# Patient Record
Sex: Female | Born: 1960
Health system: Southern US, Community
[De-identification: ages and names within clinical notes are randomized; demographics above are authoritative.]

## PROBLEM LIST (undated history)

## (undated) DIAGNOSIS — M5430 Sciatica, unspecified side: Secondary | ICD-10-CM

## (undated) HISTORY — PX: CARPAL TUNNEL RELEASE: SHX101

## (undated) HISTORY — PX: BREAST REDUCTION SURGERY: SHX8

## (undated) HISTORY — PX: TUBAL LIGATION: SHX77

---

## 2010-08-14 ENCOUNTER — Emergency Department: Payer: Self-pay | Admitting: Emergency Medicine

## 2014-09-17 ENCOUNTER — Encounter (HOSPITAL_COMMUNITY): Payer: Self-pay | Admitting: *Deleted

## 2014-09-17 ENCOUNTER — Emergency Department (HOSPITAL_COMMUNITY): Payer: BLUE CROSS/BLUE SHIELD

## 2014-09-17 ENCOUNTER — Emergency Department (HOSPITAL_COMMUNITY)
Admission: EM | Admit: 2014-09-17 | Discharge: 2014-09-17 | Disposition: A | Discharge status: Home / Self Care | Payer: BLUE CROSS/BLUE SHIELD | Attending: Emergency Medicine | Admitting: Emergency Medicine

## 2014-09-17 DIAGNOSIS — M654 Radial styloid tenosynovitis [de Quervain]: Secondary | ICD-10-CM

## 2014-09-17 DIAGNOSIS — M6588 Other synovitis and tenosynovitis, other site: Secondary | ICD-10-CM | POA: Insufficient documentation

## 2014-09-17 DIAGNOSIS — M25532 Pain in left wrist: Secondary | ICD-10-CM | POA: Diagnosis present

## 2014-09-17 NOTE — Discharge Instructions (Signed)
Please call your doctor for a followup appointment within 24-48 hours. When you talk to your doctor please let them know that you were seen in the emergency department and have them acquire all of your records so that they can discuss the findings with you and formulate a treatment plan to fully care for your new and ongoing problems. Please call and set-[facility] an [facility]  Please follow up with hand specialist When active and sleeping please wear brace If swelling occurs please apply ice Please apply heat and massage with slow circular motions to the thumb when resting Please continue to monitor symptoms closely and if symptoms are to worsen or change (fever greater than 101, chills, sweating, nausea, vomiting, chest pain, shortness of breathe, difficulty breathing, weakness, numbness, tingling, worsening or changes to pain pattern, swelling, redness, red streaks running down the arm, loss of sensation, white/blue/black discoloration to the thumb, fall, injury, loss of sensation) please report back to the Emergency Department immediately.    De Quervain's Tenosynovitis De Quervain's tenosynovitis involves inflammation of one or two tendon linings (sheaths) or strain of one or two tendons to the thumb: extensor pollicis brevis (EPB), or abductor pollicis longus (APL). This causes pain on the side of the wrist and base of the thumb. Tendon sheaths secrete a fluid that lubricates the tendon, allowing the tendon to move smoothly. When the sheath becomes inflamed, the tendon cannot move freely in the sheath. Both the EPB and APL tendons are important for proper use of the hand. The EPB tendon is important for straightening the thumb. The APL tendon is important for moving the thumb away from the index finger (abducting). The two tendons pass through a small tube (canal) in the wrist, near the base of the thumb. When the tendons become inflamed, pain is usually felt in this  area. SYMPTOMS   Pain, tenderness, swelling, warmth, or redness over the base of the thumb and thumb side of the wrist.  Pain that gets worse when straightening the thumb.  Pain that gets worse when moving the thumb away from the index finger, against resistance.  Pain with pinching or gripping.  Locking or catching of the thumb.  Limited motion of the thumb.  Crackling sound (crepitation) when the tendon or thumb is moved or touched.  Fluid-filled cyst in the area of the base of the thumb. CAUSES   Tenosynovitis is often linked with overuse of the wrist.  Tenosynovitis may be caused by repeated injury to the thumb muscle and tendon units, and with repeated motions of the hand and wrist, due to friction of the tendon within the lining (sheath).  Tenosynovitis may also be due to a sudden increase in activity or change in activity. RISK INCREASES WITH:  Sports that involve repeated hand and wrist motions (golf, bowling, tennis, squash, racquetball).  Heavy labor.  Poor physical wrist strength and flexibility.  Failure to warm up properly before practice or play.  Female gender.  New mothers who hold their baby's head for long periods or lift infants with thumbs in the infant's armpit (axilla). PREVENTION  Warm up and stretch properly before practice or competition.  Allow enough time for rest and recovery between practices and competition.  Maintain appropriate conditioning:  Cardiovascular fitness.  Forearm, wrist, and hand flexibility.  Muscle strength and endurance.  Use proper exercise technique. PROGNOSIS  This condition is usually curable within 6 weeks, if treated properly with non-surgical treatment and resting of the affected  area.  RELATED COMPLICATIONS   Longer healing time if not properly treated or if not given enough time to heal.  Chronic inflammation, causing recurring symptoms of tenosynovitis. Permanent pain or restriction of  movement.  Risks of surgery: infection, bleeding, injury to nerves (numbness of the thumb), continued pain, incomplete release of the tendon sheath, recurring symptoms, cutting of the tendons, tendons sliding out of position, weakness of the thumb, thumb stiffness. TREATMENT  First, treatment involves the use of medicine and ice, to reduce pain and inflammation. Patients are encouraged to stop or modify activities that aggravate the injury. Stretching and strengthening exercises may be advised. Exercises may be completed at home or with a therapist. You may be fitted with a brace or splint, to limit motion and allow the injury to heal. Your caregiver may also choose to give you a corticosteroid injection, to reduce the pain and inflammation. If non-surgical treatment is not successful, surgery may be needed. Most tenosynovitis surgeries are done as outpatient procedures (you go home the same day). Surgery may involve local, regional (whole arm), or general anesthesia.  MEDICATION   If pain medicine is needed, nonsteroidal anti-inflammatory medicines (aspirin and ibuprofen), or other minor pain relievers (acetaminophen), are often advised.  Do not take pain medicine for 7 days before surgery.  Prescription pain relievers are often prescribed only after surgery. Use only as directed and only as much as you need.  Corticosteroid injections may be given if your caregiver thinks they are needed. There is a limited number of times these injections may be given. COLD THERAPY   Cold treatment (icing) should be applied for 10 to 15 minutes every 2 to 3 hours for inflammation and pain, and immediately after activity that aggravates your symptoms. Use ice packs or an ice massage. SEEK MEDICAL CARE IF:   Symptoms get worse or do not improve in 2 to 4 weeks, despite treatment.  You experience pain, numbness, or coldness in the hand.  Blue, gray, or dark color appears in the fingernails.  Any of the  following occur after surgery: increased pain, swelling, redness, drainage of fluids, bleeding in the affected area, or signs of infection.  New, unexplained symptoms develop. (Drugs used in treatment may produce side effects.) Document Released: 06/16/2005 Document Revised: 09/08/2011 Document Reviewed: 09/28/2008 [facility] Patient Information 2015 [place], [place]. This information is not intended to replace advice given to you by your health care provider. Make sure you discuss any questions you have with your health care provider.   Emergency Department Resource Guide 1) Find a Doctor and Pay Out of Pocket Although you won't have to find out who is covered by your insurance plan, it is a good idea to ask around and get recommendations. You will then need to call the office and see if the doctor you have chosen will accept you as a new patient and what types of options they offer for patients who are self-pay. Some doctors offer discounts or will set up payment plans for their patients who do not have insurance, but you will need to ask so you aren't surprised when you get to your appointment.  2) Contact Your Local Health Department Not all health departments have doctors that can see patients for sick visits, but many do, so it is worth a call to see if yours does. If you don't know where your local health department is, you can check in your phone book. The CDC also has a tool to help you locate  your state's health department, and many state websites also have listings of all of their local health departments.  3) Find a [facility] If your illness is not likely to be very severe or complicated, you may want to try a walk in clinic. These are popping up all over the country in pharmacies, drugstores, and shopping centers. They're usually staffed by nurse practitioners or physician assistants that have been trained to treat common illnesses and complaints. They're usually fairly quick and  inexpensive. However, if you have serious medical issues or chronic medical problems, these are probably not your best option.  No Primary Care Doctor: - Call Health Connect at  [phone] - they can help you locate a primary care doctor that  accepts your insurance, provides certain services, etc. - Physician Referral Service- [phone]  Chronic Pain Problems: Organization         Address  Phone   Notes  [facility]  [phone] Patients need to be referred by their primary care doctor.   Medication Assistance: Organization         Address  Phone   Notes  [facility] Medication [facility] [address] [place], [place] [address] [phone] --Must be a resident of [facility] -- Must have NO insurance coverage whatsoever (no Medicaid/ Medicare, etc.) -- The pt. MUST have a primary care doctor that directs their care regularly and follows them in the community   [facility]  [phone]   [employer]  [phone]    Agencies that provide inexpensive medical care: Organization         Address  Phone   Notes  [place]  [phone]   [facility]    [phone]   [facility] [place], [place] [address] [phone]   [place] [address] [place] [phone]   Planned Parenthood    [phone]   [facility]    [phone]   [place] [address], [place] Phone:  [phone], Fax:  [phone] Hours of Operation:  9 am - 6 pm, M-F.  Also accepts Medicaid/Medicare and self-pay.  [facility] for [place] [address], [address], [place] Phone: [phone], Fax: [phone]. Hours of Operation:  8:30 am - 5:30 pm, M-F.  Also accepts Medicaid and self-pay.  [facility] [place] [address] [place] Phone: [phone]   [place], [place], [place] [phone], Ext. [phone] Mondays & Thursdays: 7-9 AM.  First 15 patients are seen on a first come, first serve basis.    [place] Providers:  Organization         Address  Phone   Notes  [facility] [address], [place] [phone] Also accepts self-pay patients.  [facility] [address], [place]  [phone]   [place], [address], [place] [phone]   [facility] [address], [place] [phone]   Lucianne Lei [address], [place]   [phone] Only accepts [place] Access [place] patients after they have their name applied to their card.   Self-Pay (no insurance) in [facility]:  Organization  Address  Phone   Notes  Sickle Cell Patients, [facility] [place] [phone]   [facility] [place] [phone]   [facility]  [place], [address], [place] [phone]   [facility]/Dr. Osei-Bonsu  [address] [place] or [address], [place] [phone] Phone number for both [place] and [place] locations is the same.  [facility] and [facility] [address] [place] [phone]   [facility] [address], [place] or [address] [phone]   [facility] [address], [place] [phone], phone; [phone], fax Sees patients 1st and 3rd Saturday of every month.  Must not qualify for public or private insurance (i.e. Medicaid, Medicare, [place] Health Choice, Veterans' Benefits)  Household income should be no more than 200% of the poverty level The clinic cannot treat you if you are pregnant or  think you are pregnant  Sexually transmitted diseases are not treated at the clinic.    Dental Care: Organization         Address  Phone  Notes  [facility] Department of [facility] [place] [phone] Accepts children up to age 3 who are enrolled in [place] or [place]; pregnant women with a Medicaid card; and children who have applied for Medicaid or [place] Health Choice, but were declined, whose parents can pay a reduced fee at time of service.  [facility]  [address], [place] [phone] Accepts children up to age 81 who are enrolled in [place] or [place]; pregnant women with a Medicaid card; and children who have applied for Medicaid or [place] Health Choice, but were declined, whose parents can pay a reduced fee at time of service.  [place] [facility]  [place] [phone] Patients are seen by appointment only. Walk-ins are not accepted. [place] will see patients 82 years of age and older. Monday - Tuesday (8am-5pm) Most Wednesdays (8:30-5pm) $30 per visit, cash only  [facility] PRO[facility]  [address], [facility] [phone] Patients are seen by appointment only. Walk-ins are not accepted. [place] will see patients 17 years of age and older. One Wednesday Evening (Monthly: Volunteer Based).  $30 per visit, cash only  [place]  [phone] for adults; Children under age 57, call Graduate Pediatric Dentistry at [phone]. Children aged 44-14, please call [phone] to request a pediatric application.  Dental services are provided in all areas of dental care including fillings, crowns and bridges, complete and partial dentures, implants, gum treatment, root canals, and extractions. Preventive care is also provided. Treatment is provided to both adults  and children. Patients are selected via a lottery and there is often a waiting list.   [facility] [address] [place]  [phone] [email or web address]   [facility] [address] [place], [place] [phone], Ext. [phone] Second and Fourth Thursday of each month, opens at 6:30 AM; Clinic ends at 9 AM.  Patients are seen on a first-come first-served basis, and a limited number are seen during each clinic.   [facility]  [address] [place], [place]  [place], [place] [phone]   Eligibility Requirements °You must have lived in [place], [place], or [place] counties for at least the last three months. °  You cannot be eligible for state or federal sponsored healthcare insurance, including Veterans Administration, Medicaid, or Medicare. °  You generally cannot be eligible for healthcare insurance through your employer.  °  How to apply: °Eligibility screenings are held every Tuesday and Wednesday afternoon from 1:00 pm until 4:00 pm. You do not need an appointment for the interview!  °[facility] [address], [place], [place] [phone]   °[place] [facility]  [phone]   °[facility]  [phone]   °[facility]  336-570-6415   ° °Behavioral Health Resources in the Community: °Intensive Outpatient Programs °Organization         Address  Phone  Notes  °[facility] [address], [place], [place] [phone]   °[facility] [address], [place], [place] [phone]   °[facility] [address], [place], [place] ° 336-882-2125   °[facility] [address],  °[place], [place] 1-800-853-5163 or 336-641-4981   °Substance Abuse Resources °Organization         Address  Phone  Notes  °[facility]  336-882-2125   °[facility]  336-784-9470   °[facility]  336-285-9073    °[facility]  336-845-3988   °Residential & Outpatient Substance Abuse Program  1-800-659-3381   °Psychological Services °Organization         Address  Phone  Notes  °[facility]  336- [phone]   °[facility]  336- 378-7881   °[facility] [address], [place] 1-800-853-5163 or 336-641-4981   ° °Mobile Crisis Teams °Organization         Address  Phone  Notes  °[facility], Mobile Crisis Care Unit  1-877-626-1772   °Assertive °[facility] ° [address] [place], [place] 336-834-9664   °[place]aron DeEsch [address] °[place] 336-554-5454   ° °Self-Help/Support Groups °Organization         Address  Phone             Notes  °[facility] [place] - variety of support groups  336- 373-1402 Call for more information  °Narcotics Anonymous (NA), [facility] [address], °[place]  2 meetings at this location  ° °Residential Treatment Programs °Organization         Address  Phone  Notes  °[facility] [address],    °[place]  1-866-801-8205   °[facility] ° [address], [place], [place] [phone]   °[facility] [address], [place] 336-845-3988 Admissions: 8am-3pm M-F  °[facility] [address]    °[place], [place] 336-841-1104   °[facility] [address], [place], [place] 336-379-7146   °[facility] [address]  °[place], [place] 336-285-9073   °[facility] - Intensive Outpatient [address], [place], [place] 336-852-3033   °[facility] [address]  °[place], [place] 1-877-615-2722 or 336-784-9470   °[facility] ([facility]) [address] [place], [place] 336-227-7417 Accepts Medicaid  °[facility] [address]  °[place] 1-800-659-3381 Substance Abuse/Addiction Treatment  ° °[place]  Behavioral Health Resources °Organization           Address  Phone  Notes  °[facility]  (888) [phone]   °Julie Brannon, PhD [address] [place], [place]   (336) 349-5553 or (336) 951-0000   °[facility]   [address] °[place], [place] (336) 349-4454   °[facility] [address], [place], [place] (336) 342-8316 Insurance/Medicaid/sponsorship through [facility]  °[facility] [address]                                    [place], [place] (336) 342-8316 Therapy/tele-psych/case  °[facility] [address]  ° [place], [place] (336) 349-2233    °Dr. Arfeen  (336) 349-4544   °[facility] [place]  [employer] [place] [facility] 1) [address], [place] °2) [address], [place] °3)  [address], [place] (336) 349-3220 °(336) 342-7768 ° °(336) 342-8140   °[place] Child Abuse Hotline (336) 342-1394 or (336) 342-3537 (After Hours)    ° ° ° °

## 2014-09-17 NOTE — ED Provider Notes (Signed)
CSN: [id number]     Arrival date & time 09/17/14  0919 History   First MD Initiated Contact with Patient 09/17/14 [phone]     Chief Complaint  Patient presents with  . Wrist Pain     (Consider location/radiation/quality/duration/timing/severity/associated sxs/prior Treatment) The history is provided by the patient. No language interpreter was used.  Lisa Wagner is a 54 year old female with past medical history of breast reduction surgery, carpal tunnel release, tubal ligation presenting to the ED with left wrist pain has been ongoing since February 2016. Patient reports that the pain comes and goes. Reported that the pain is mainly localized at the base of the thumb, radial aspect of the left wrist. Reported that when she stretches her thumb she feels a popping sensation and that it radiates down her forearm. Described as an aching pain that is sore to the touch. Reported that [facility] June/July 2015 [facility] where she was given a shot of steroids. Reported that she's been using Aleve with minimal relief. Reported that she is right-hand dominant. Denied fall, injury, changes to skin colored, fevers, loss of sensation, numbness, tingling. PCP none  History reviewed. No pertinent past medical history. Past Surgical History  Procedure Laterality Date  . Tubal ligation    . Carpal tunnel release    . Breast reduction surgery     No family history on file. History  Substance Use Topics  . Smoking status: Never Smoker   . Smokeless tobacco: Not on file  . Alcohol Use: No   OB History    No data available     Review of Systems  Constitutional: Negative for fever and chills.  Musculoskeletal: Positive for arthralgias (Left wrist pain). Negative for neck pain and neck stiffness.  Skin: Negative for color change.  Neurological: Negative for weakness and numbness.      Allergies  Review of patient's allergies indicates not on file.  Home Medications     Prior to Admission medications   Not on File   BP 128/60 mmHg  Pulse 77  Temp(Src) 98.3 F (36.8 C) (Oral)  Resp 17  SpO2 100% Physical Exam  Constitutional: She is oriented to person, place, and time. She appears well-developed and well-nourished. No distress.  HENT:  Head: Normocephalic and atraumatic.  Eyes: Conjunctivae and EOM are normal. Right eye exhibits no discharge. Left eye exhibits no discharge.  Neck: Normal range of motion. Neck supple.  Cardiovascular: Normal rate, regular rhythm and normal heart sounds.   Pulses:      Radial pulses are 2+ on the right side, and 2+ on the left side.  Pulmonary/Chest: Effort normal and breath sounds normal. No respiratory distress. She has no wheezes. She has no rales.  Abdominal: Soft.  Musculoskeletal: Normal range of motion. She exhibits tenderness.       Left wrist: She exhibits tenderness. She exhibits normal range of motion, no bony tenderness, no swelling, no effusion, no crepitus and no deformity.       Arms: Very mild swelling identified to the radial aspect of the left wrist with mild tenderness upon palpation to the tendon following the radial aspect of the left wrist. Negative erythema, warmth upon palpation, lesions, sores, deformities. Negative crepitus upon palpation. Negative snuffbox tenderness. Full range of motion to the left thumb identified with discomfort. Negative Allen's or Tinel's sign. Positive Finkelstein test.  Neurological: She is alert and oriented to person, place, and time. No cranial nerve deficit. She  exhibits normal muscle tone. Coordination normal.  Strength 5+/5+ to upper extremities bilaterally with resistance applied, equal distribution noted Strength intact to MCP, PIP, DIP joints of left hand Sensation intact with differentiation to sharp and dull touch  Skin: Skin is warm and dry. No rash noted. She is not diaphoretic. No erythema.  Psychiatric: She has a normal mood and affect. Her behavior is  normal. Thought content normal.  Nursing note and vitals reviewed.   ED Course  Procedures (including critical care time) Labs Review Labs Reviewed - No data to display  Imaging Review Dg Wrist Complete Left  09/17/2014   CLINICAL DATA:  Left wrist pain for 2 months  EXAM: LEFT WRIST - COMPLETE 3+ VIEW  COMPARISON:  None.  FINDINGS: No acute fracture or dislocation is noted. Some lucency is noted in the distal radius which appears postsurgical in nature. Correlation with patient's history is recommended.  IMPRESSION: No acute abnormality noted.   Electronically Signed   By: Alcide Clever M.D.   On: 09/17/2014 10:04     EKG Interpretation None      MDM   Final diagnoses:  Tendinitis, de Quervain's    Medications - No data to display  Filed Vitals:   09/17/14 0929  BP: 128/60  Pulse: 77  Temp: 98.3 F (36.8 C)  TempSrc: Oral  Resp: 17  SpO2: 100%   Plain film of left wrist no acute abnormalities identified. Doubt septic joint. Doubt tenosynovitis. Suspicion to be de Quervain's tendinitis secondary to positive Finkelstein test. Cannot rule out possible beginnings of carpal tunnel. Radial pulses palpable and strong. Strength intact. Equal grip strength. Full range of motion identified. Sensation intact. Negative focal neurological deficits. Patient placed in thumb spica for comfort purposes. Patient stable, afebrile. Patient not septic appearing. Discharged patient. [facility] and [facility] specialist. Discussed with patient to rest, ice, apply heat and massage. Discussed with patient to closely monitor symptoms and if symptoms are to worsen or change to report back to the ED - strict return instructions given.  Patient agreed to plan of care, understood, all questions answered.    Raymon Mutton, PA-C 09/17/14 1040  Azalia Bilis, MD 09/17/14 [phone]

## 2014-09-17 NOTE — ED Notes (Signed)
Pt reports pain at base of left thumb into wrist, "sore to the touch" symptoms since Feb with worsening symptoms mid Feb. Also report rt middle finger "locking up" for 5-6 months on and of

## 2015-10-20 IMAGING — CR DG WRIST COMPLETE 3+V*L*
4 series · 4 of 4 positions shown · non-contrast
Comparison: None.

CLINICAL DATA: Left wrist pain for 2 months

EXAM:
LEFT WRIST - COMPLETE 3+ VIEW

[x wrist pa left]
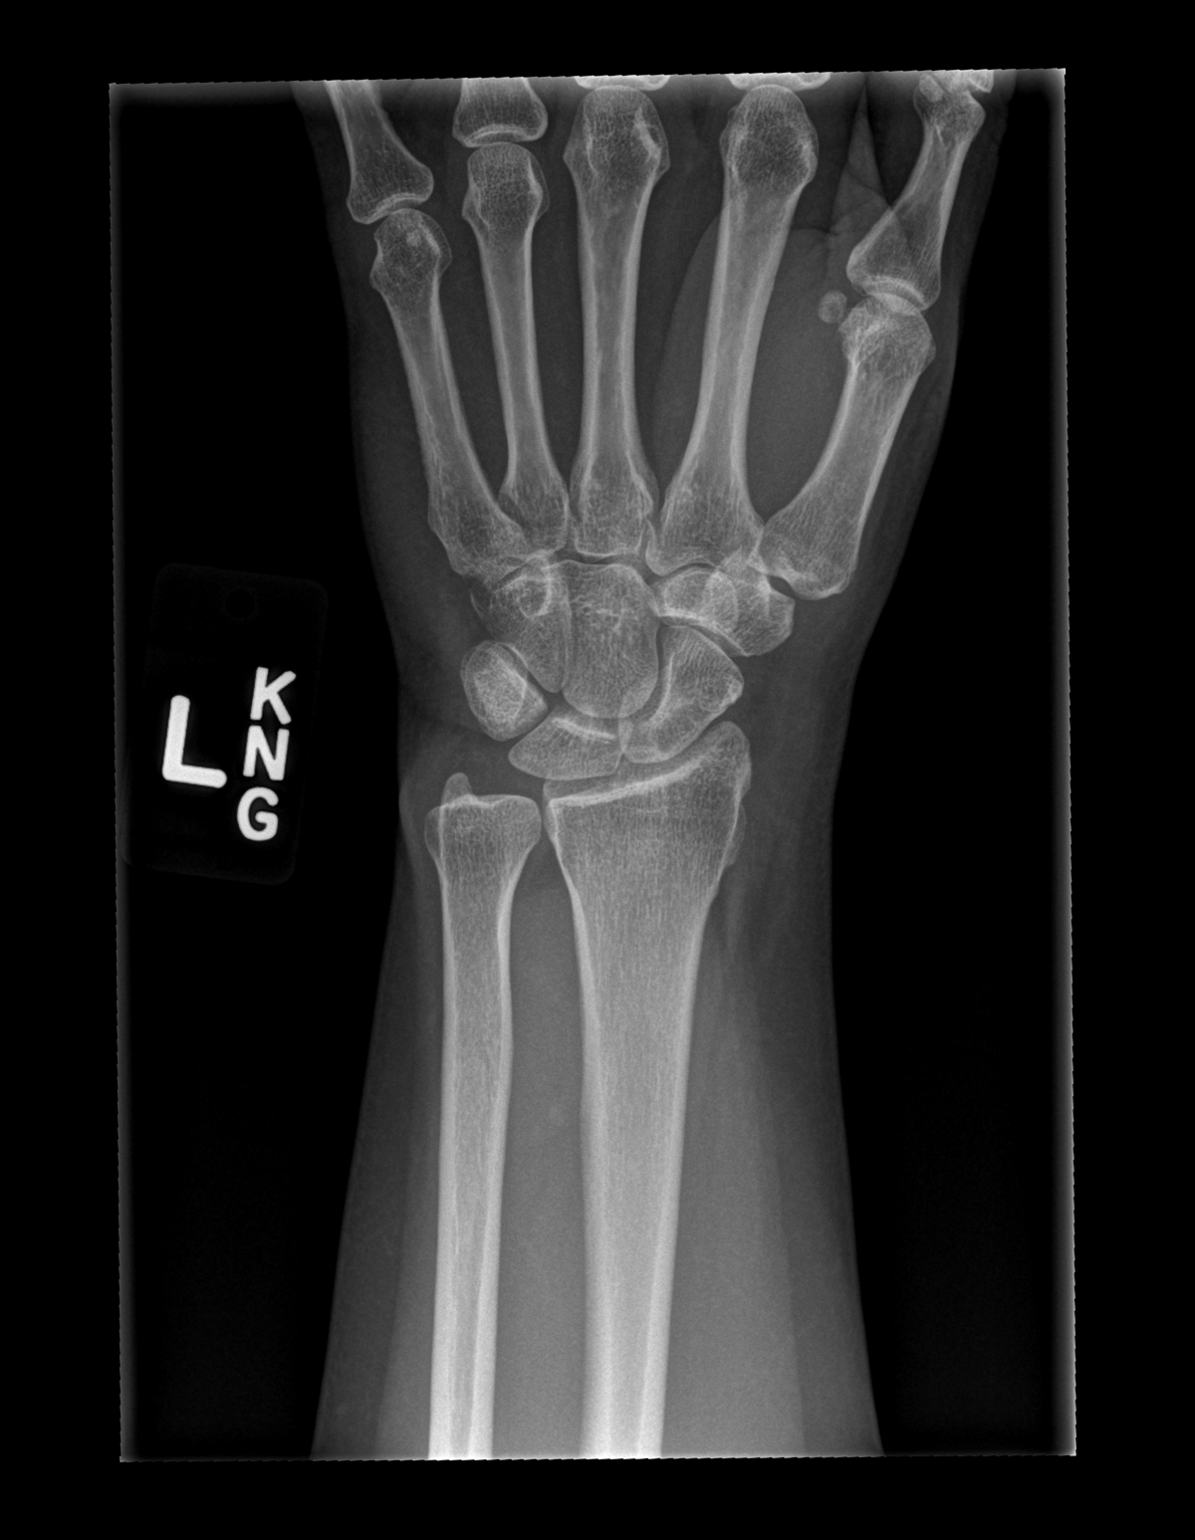

[x wrist obl left]
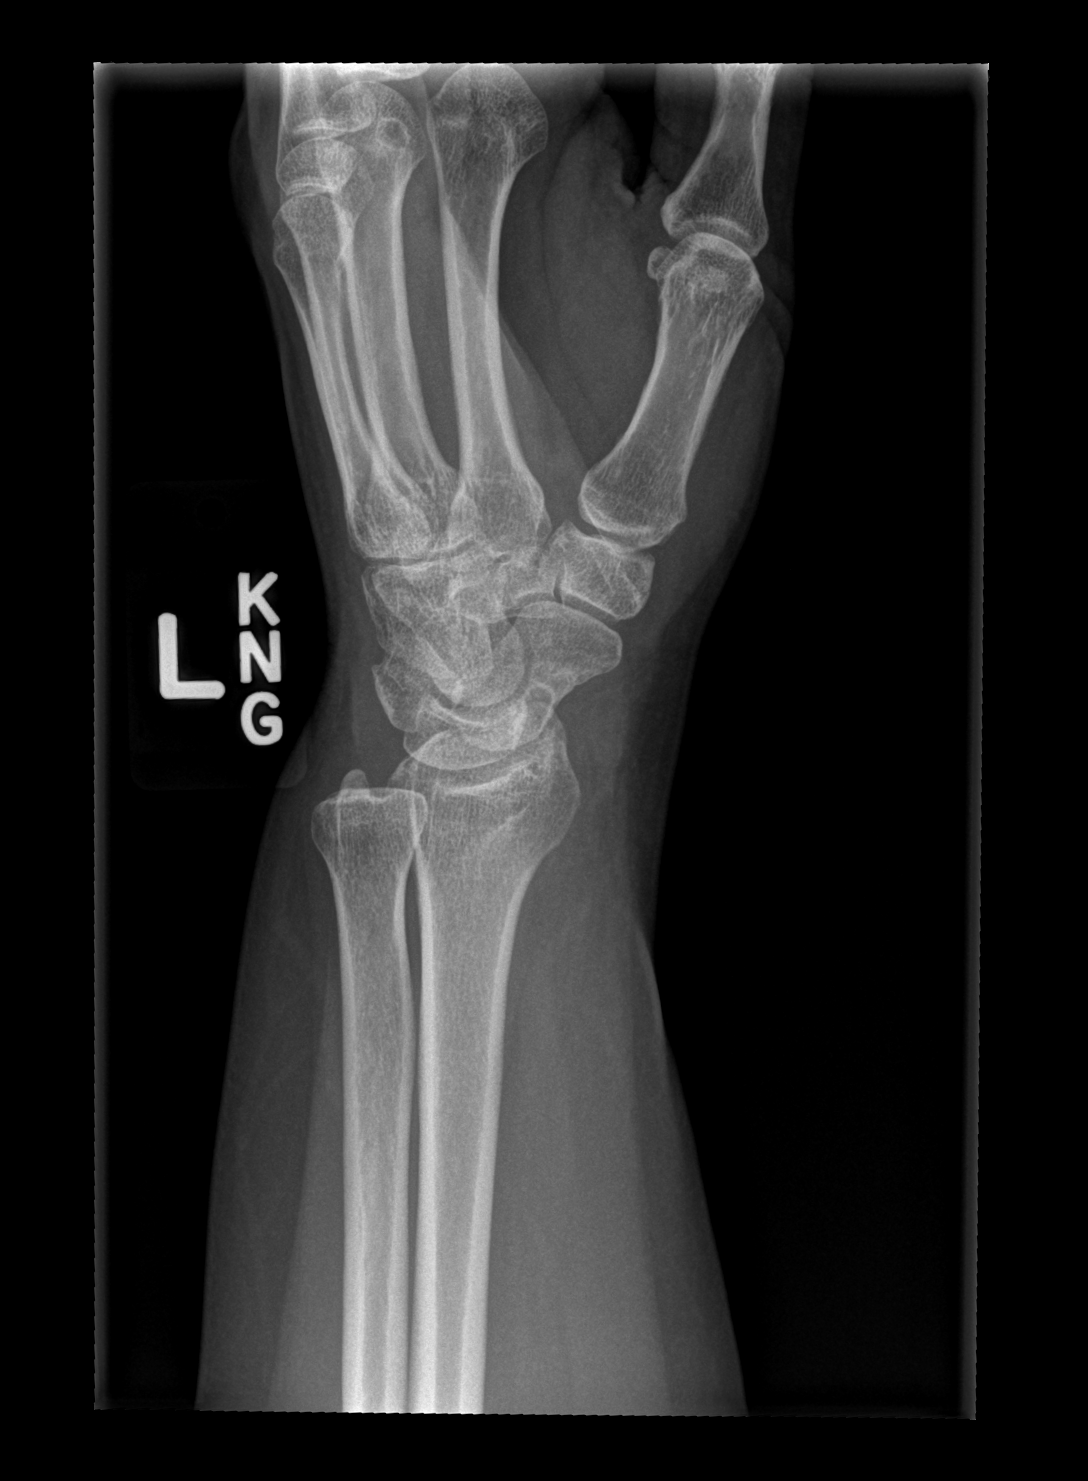

[x wrist lat left]
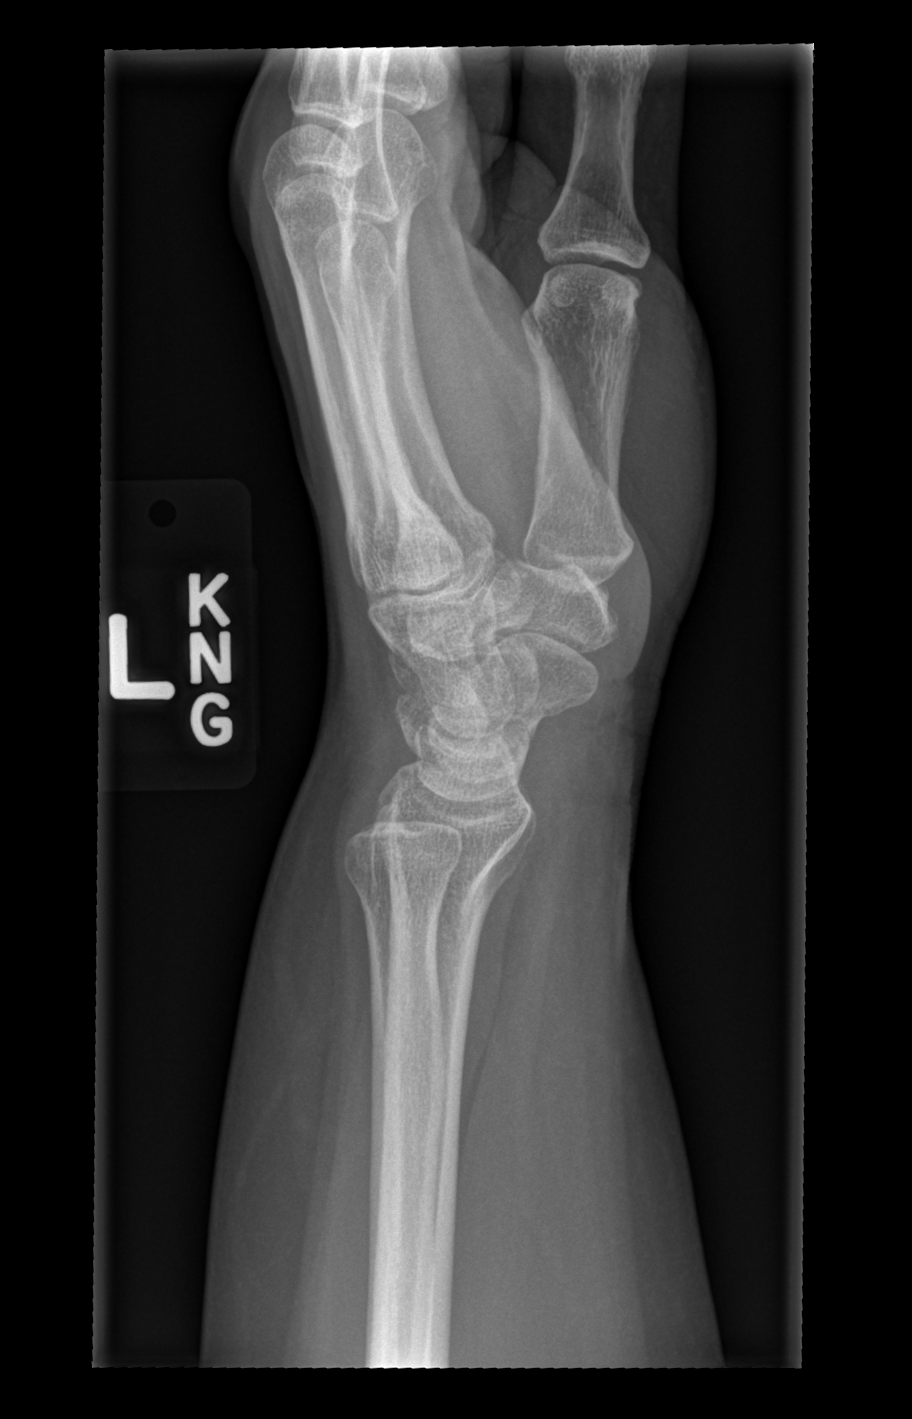

[x wrist navicular view left]
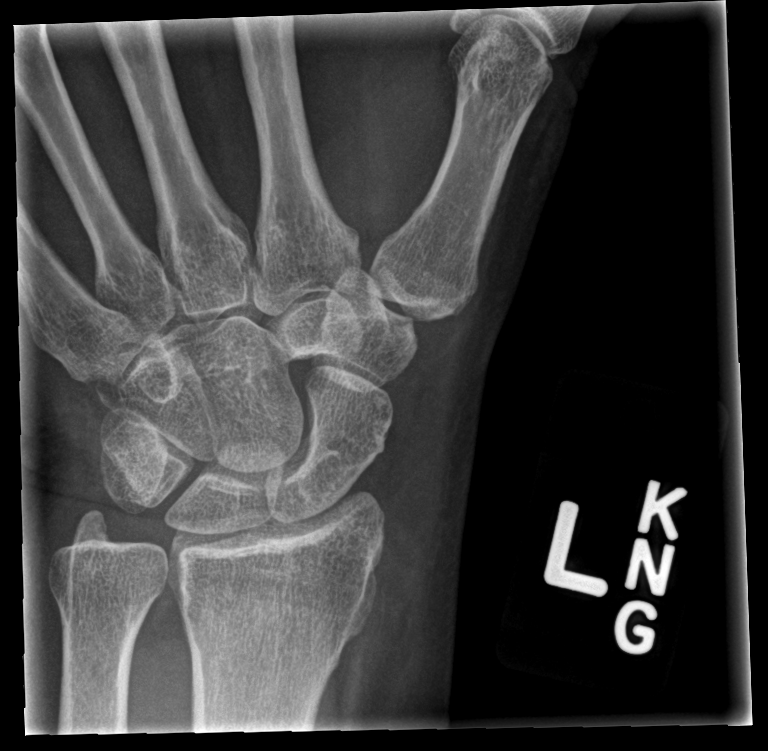

[4 of 4 positions shown; findings below may reference images not displayed]

FINDINGS: No acute fracture or dislocation is noted. Some lucency is noted in
the distal radius which appears postsurgical in nature. Correlation
with patient's history is recommended.
IMPRESSION: No acute abnormality noted.

## 2016-06-01 ENCOUNTER — Encounter (HOSPITAL_BASED_OUTPATIENT_CLINIC_OR_DEPARTMENT_OTHER): Payer: Self-pay | Admitting: *Deleted

## 2016-06-01 ENCOUNTER — Emergency Department (HOSPITAL_BASED_OUTPATIENT_CLINIC_OR_DEPARTMENT_OTHER)
Admission: EM | Admit: 2016-06-01 | Discharge: 2016-06-01 | Disposition: A | Discharge status: Home / Self Care | Payer: BLUE CROSS/BLUE SHIELD | Attending: Emergency Medicine | Admitting: Emergency Medicine

## 2016-06-01 DIAGNOSIS — H6501 Acute serous otitis media, right ear: Secondary | ICD-10-CM

## 2016-06-01 DIAGNOSIS — J069 Acute upper respiratory infection, unspecified: Secondary | ICD-10-CM | POA: Insufficient documentation

## 2016-06-01 MED ORDER — DEXAMETHASONE 6 MG PO TABS
12.0000 mg | ORAL_TABLET | Freq: Once | ORAL | Status: AC
Start: 2016-06-01 — End: 2016-06-01
  Administered 2016-06-01: 12 mg via ORAL
  Filled 2016-06-01: qty 2

## 2016-06-01 NOTE — Discharge Instructions (Signed)
Use Afrin Nasal Spray for the next three days - DO NOT USE IT FOR MORE THAN THREE DAYS!  If the ingredients list on your Sudafed box shows only one medication (pseudoephedrine) then take Claritin or Zyrtec along with it.  If pain gets worse or if you start running a fever, then you will need to be checked to see if you need to start on antibiotics at that point.

## 2016-06-01 NOTE — ED Provider Notes (Signed)
[facility]   CSN: [id number] Arrival date & time: 06/01/16  0208     History   Chief Complaint Chief Complaint  Patient presents with  . Otalgia    HPI Lisa Wagner is a 55 y.o. female.  She has had a cold with nasal congestion and rhinorrhea for about the last 5 days. For the last 3 days, she has noted that there is fullness in her right ear and sounds are muffled. There is not any actual pain from this. She continues to have nasal mucus production which is mostly clear. She denies fever, chills, sweats. There is mild throat irritation but no true sore throat. There is minimal cough. There's been no vomiting or diarrhea. She denies arthralgias or myalgias. She brought some Sudafed which she has been taking without relief. She is not sure there is an antihistamine that is combined with her Sudafed. She does admit to some sick contacts at work.   The history is provided by the patient.    History reviewed. No pertinent past medical history.  There are no active problems to display for this patient.   Past Surgical History:  Procedure Laterality Date  . BREAST REDUCTION SURGERY    . CARPAL TUNNEL RELEASE    . TUBAL LIGATION      OB History    No data available       Home Medications    Prior to Admission medications   Not on File    Family History No family history on file.  Social History Social History  Substance Use Topics  . Smoking status: Never Smoker  . Smokeless tobacco: Never Used  . Alcohol use No     Allergies   Latex   Review of Systems Review of Systems  All other systems reviewed and are negative.    Physical Exam Updated Vital Signs BP 119/66 (BP Location: Right Arm)   Pulse 78   Temp 98.6 F (37 C) (Oral)   Resp 20   Ht 5\' 6"  (1.676 m)   Wt 206 lb (93.4 kg)   SpO2 96%   BMI 33.25 kg/m   Physical Exam  Nursing note and vitals reviewed.  55 year old female, resting comfortably and in no  acute distress. Vital signs are normal. Oxygen saturation is 96%, which is normal. Head is normocephalic and atraumatic. PERRLA, EOMI. Oropharynx is clear. Right ear shows a serous otitis media without erythema or bulging of the tympanic membrane. Left tympanic membrane is normal. Examination the nasal cavity shows no drainage or edema of turbinates. Neck is nontender and supple without adenopathy or JVD. Back is nontender and there is no CVA tenderness. Lungs are clear without rales, wheezes, or rhonchi. Chest is nontender. Heart has regular rate and rhythm without murmur. Abdomen is soft, flat, nontender without masses or hepatosplenomegaly and peristalsis is normoactive. Extremities have no cyanosis or edema, full range of motion is present. Skin is warm and dry without rash. Neurologic: Mental status is normal, cranial nerves are intact, there are no motor or sensory deficits.  ED Treatments / Results   Procedures Procedures (including critical care time)  Medications Ordered in ED Medications  dexamethasone (DECADRON) tablet 12 mg (not administered)     Initial Impression / Assessment and Plan / ED Course  I have reviewed the triage vital signs and the nursing notes.  Pertinent labs & imaging results that were available during my care of the patient were reviewed by me and  considered in my medical decision making (see chart for details).  Clinical Course    Upper respiratory infection with serous otitis media on the right. I have explained to patient this is not an active infection at this point and does not require antibiotics. However, it was explained that her cam be a secondary infection. She's given a dose of dexamethasone in the ED and is advised to add an antihistamine to her regimen if her pseudoephedrine does not already contain an antihistamine. She is also advised to use Afrin nasal spray for the next 3 days. Return should she develop fever or increased pain.  Final  Clinical Impressions(s) / ED Diagnoses   Final diagnoses:  Right acute serous otitis media, recurrence not specified    New Prescriptions New Prescriptions   No medications on file     Dione Boozeavid Caldwell Kronenberger, MD 06/01/16 [phone]

## 2016-06-01 NOTE — ED Triage Notes (Signed)
Pt c/o congestion that started on Tuesday. States stuffy nose on Wednesday. C/o right ear hurting on Friday. Has taken sudafed without relief. States she has a fever on Tuesday and Wednesday. None since. Non prod cough

## 2016-06-03 ENCOUNTER — Emergency Department (HOSPITAL_BASED_OUTPATIENT_CLINIC_OR_DEPARTMENT_OTHER)
Admission: EM | Admit: 2016-06-03 | Discharge: 2016-06-03 | Disposition: A | Discharge status: Home / Self Care | Payer: BLUE CROSS/BLUE SHIELD | Attending: Emergency Medicine | Admitting: Emergency Medicine

## 2016-06-03 DIAGNOSIS — J069 Acute upper respiratory infection, unspecified: Secondary | ICD-10-CM | POA: Insufficient documentation

## 2016-06-03 MED ORDER — AMOXICILLIN-POT CLAVULANATE 875-125 MG PO TABS: 1.0000 | ORAL_TABLET | Freq: Two times a day (BID) | ORAL | 0 refills | Status: DC

## 2016-06-03 MED ORDER — BENZONATATE 100 MG PO CAPS: 100.0000 mg | ORAL_CAPSULE | Freq: Three times a day (TID) | ORAL | 0 refills | Status: DC

## 2016-06-03 NOTE — ED Provider Notes (Signed)
[facility]   CSN: [id number] Arrival date & time: 06/03/16  1549  By signing my name below, I, Lisa Wagner, attest that this documentation has been prepared under the direction and in the presence of Lisa SauerJaime Rian Busche, PA-C Electronically Signed: Soijett Wagner, ED Scribe. 06/03/16. 6:23 PM.  History   Chief Complaint Chief Complaint  Patient presents with  . Otalgia    HPI Lisa Wagner is a 55 y.o. female who presents to the Emergency Department complaining of right ear pain onset 1 week ago. Associated symptoms include right sinus pain. Productive cough which started clear and now has turned green. Pt notes that she was seen in the ED for URI-like symptoms but not prescribed any medications for her symptoms. She was given a steroid shot and noted that nasal congestion and ear pain have improved, however sinus pain seems to be getting worse. She has been using Sudafed as well. She denies fever, chills, ear discharge,neck pain, shortness of breath.   Per pt chart review: Pt was seen in the ED on 06/01/2016 for right ear pain. Pt was given decadron tablet while in the ED and advised to use afrin and antihistamine for their symptoms.   The history is provided by the patient. No language interpreter was used.    No past medical history on file.  There are no active problems to display for this patient.   Past Surgical History:  Procedure Laterality Date  . BREAST REDUCTION SURGERY    . CARPAL TUNNEL RELEASE    . TUBAL LIGATION      OB History    No data available       Home Medications    Prior to Admission medications   Medication Sig Start Date End Date Taking? Authorizing Provider  amoxicillin-clavulanate (AUGMENTIN) 875-125 MG tablet Take 1 tablet by mouth every 12 (twelve) hours. Fill on 12/09 06/07/16   [facility] Shineka Auble, PA-C  benzonatate (TESSALON) 100 MG capsule Take 1 capsule (100 mg total) by mouth every 8 (eight) hours. 06/03/16   Chase PicketJaime  Pilcher Lisa Kittleson, PA-C    Family History No family history on file.  Social History Social History  Substance Use Topics  . Smoking status: Never Smoker  . Smokeless tobacco: Never Used  . Alcohol use No     Allergies   Latex   Review of Systems Review of Systems  Constitutional: Negative for chills and fever.  HENT: Positive for ear pain (right) and rhinorrhea. Negative for ear discharge.   Respiratory: Positive for cough.      Physical Exam Updated Vital Signs BP 126/58   Pulse 70   Temp 98.2 F (36.8 C) (Oral)   Resp 18   SpO2 99%   Physical Exam  Constitutional: She is oriented to person, place, and time. She appears well-developed and well-nourished. No distress.  HENT:  Head: Normocephalic and atraumatic.  OP with erythema, no exudates or tonsillar hypertrophy. + nasal congestion with mucosal edema. TTP to right maxillary sinuses. Left TM normal. Right TM with fluid but no erythema or bulging.   Neck: Normal range of motion. Neck supple.  No meningeal signs.   Cardiovascular: Normal rate, regular rhythm and normal heart sounds.   No murmur heard. Pulmonary/Chest: Effort normal and breath sounds normal. No respiratory distress.  Lungs are clear to auscultation bilaterally - no w/r/r  Abdominal: Soft. She exhibits no distension. There is no tenderness.  Musculoskeletal: Normal range of motion. She exhibits no edema.  Neurological:  She is alert and oriented to person, place, and time.  Skin: Skin is warm and dry. She is not diaphoretic.  Nursing note and vitals reviewed.    ED Treatments / Results  DIAGNOSTIC STUDIES: Oxygen Saturation is 99% on RA, normal by my interpretation.    COORDINATION OF CARE: 6:23 PM Discussed treatment Wagner with pt at bedside    Labs (all labs ordered are listed, but only abnormal results are displayed) Labs Reviewed - No data to display  EKG  EKG Interpretation None       Radiology No results  found.  Procedures Procedures (including critical care time)  Medications Ordered in ED Medications - No data to display   Initial Impression / Assessment and Wagner / ED Course  I have reviewed the triage vital signs and the nursing notes.  Pertinent labs & imaging results that were available during my care of the patient were reviewed by me and considered in my medical decision making (see chart for details).  Clinical Course    Lisa Wagner presents to ED today for persistent URI symptoms. Seen in ED two days ago and given steroid shot which did seem to help. Right sinus pain has not improved and actually feels a little worse. On exam, she is afebrile, non-toxic appearing with a clear lung exam. TM's are not erythematous or bulging. Symptoms likely 2.2 viral URI. She does have tenderness to the right maxillary sinus. Will give rx for augmentin dated to fill in 4 days if symptoms are not improved. PCP follow up. Return precautions discussed.  Patient voices understanding and is agreeable to Wagner.   Blood pressure 126/58, pulse 70, temperature 98.2 F (36.8 C), temperature source Oral, resp. rate 18, SpO2 99 %.   Final Clinical Impressions(s) / ED Diagnoses   Final diagnoses:  Upper respiratory tract infection, unspecified type    New Prescriptions Discharge Medication List as of 06/03/2016  5:50 PM    START taking these medications   Details  amoxicillin-clavulanate (AUGMENTIN) 875-125 MG tablet Take 1 tablet by mouth every 12 (twelve) hours. Fill on 12/09, Starting Sat 06/07/2016, Print    benzonatate (TESSALON) 100 MG capsule Take 1 capsule (100 mg total) by mouth every 8 (eight) hours., Starting Tue 06/03/2016, Print       I personally performed the services described in this documentation, which was scribed in my presence. The recorded information has been reviewed and is accurate.     [facility] Pilcher Yassine Brunsman, PA-C 06/03/16 1824    Lisa PlanJoshua G Long, MD 06/04/16 1017

## 2016-06-03 NOTE — Discharge Instructions (Signed)
Tessalon for cough. If symptoms have worsened by Saturday, please begin taking the antibiotic provided.  Increase hydration.  Return to the ER for high fevers, difficulty breathing or other concerning symptoms

## 2016-06-03 NOTE — ED Notes (Signed)
Patient with mask sitting, no distress noted at this time

## 2016-06-03 NOTE — ED Triage Notes (Signed)
States she is here for a recheck of pain in her right ear and sinus drainage that has gone from clear to green.

## 2016-07-26 ENCOUNTER — Telehealth (HOSPITAL_BASED_OUTPATIENT_CLINIC_OR_DEPARTMENT_OTHER): Payer: Self-pay | Admitting: *Deleted

## 2016-07-27 ENCOUNTER — Emergency Department (HOSPITAL_BASED_OUTPATIENT_CLINIC_OR_DEPARTMENT_OTHER)
Admission: EM | Admit: 2016-07-27 | Discharge: 2016-07-27 | Disposition: A | Discharge status: Home / Self Care | Payer: BLUE CROSS/BLUE SHIELD | Attending: Emergency Medicine | Admitting: Emergency Medicine

## 2016-07-27 ENCOUNTER — Encounter (HOSPITAL_BASED_OUTPATIENT_CLINIC_OR_DEPARTMENT_OTHER): Payer: Self-pay | Admitting: Emergency Medicine

## 2016-07-27 DIAGNOSIS — B9789 Other viral agents as the cause of diseases classified elsewhere: Secondary | ICD-10-CM

## 2016-07-27 DIAGNOSIS — Z79899 Other long term (current) drug therapy: Secondary | ICD-10-CM | POA: Insufficient documentation

## 2016-07-27 DIAGNOSIS — J069 Acute upper respiratory infection, unspecified: Secondary | ICD-10-CM | POA: Insufficient documentation

## 2016-07-27 MED ORDER — MOMETASONE FUROATE 50 MCG/ACT NA SUSP: 2.0000 | Freq: Two times a day (BID) | NASAL | 0 refills | Status: DC

## 2016-07-27 MED ORDER — FEXOFENADINE-PSEUDOEPHED ER 60-120 MG PO TB12: 1.0000 | ORAL_TABLET | Freq: Two times a day (BID) | ORAL | 0 refills | Status: AC

## 2016-07-27 MED ORDER — OXYMETAZOLINE HCL 0.05 % NA SOLN
1.0000 | Freq: Once | NASAL | Status: AC
  Administered 2016-07-27: 1 via NASAL
  Filled 2016-07-27: qty 15

## 2016-07-27 NOTE — ED Provider Notes (Signed)
[facility]   CSN: [id number] Arrival date & time: 07/27/16  [id number]     History   Chief Complaint Chief Complaint  Patient presents with  . URI    HPI Lisa Wagner is a 56 y.o. female.  The history is provided by the patient.  URI   This is a new problem. Episode onset: 6 days. The problem has been gradually worsening. There has been no fever. Associated symptoms include congestion, rhinorrhea and cough. She has tried nothing for the symptoms.    History reviewed. No pertinent past medical history.  There are no active problems to display for this patient.   Past Surgical History:  Procedure Laterality Date  . BREAST REDUCTION SURGERY    . CARPAL TUNNEL RELEASE    . TUBAL LIGATION      OB History    No data available       Home Medications    Prior to Admission medications   Medication Sig Start Date End Date Taking? Authorizing Provider  amoxicillin-clavulanate (AUGMENTIN) 875-125 MG tablet Take 1 tablet by mouth every 12 (twelve) hours. Fill on 12/09 06/07/16   [facility] Ward, PA-C  benzonatate (TESSALON) 100 MG capsule Take 1 capsule (100 mg total) by mouth every 8 (eight) hours. 06/03/16   Chase PicketJaime Pilcher Ward, PA-C    Family History History reviewed. No pertinent family history.  Social History Social History  Substance Use Topics  . Smoking status: Never Smoker  . Smokeless tobacco: Never Used  . Alcohol use No     Allergies   Latex   Review of Systems Review of Systems  HENT: Positive for congestion and rhinorrhea.   Respiratory: Positive for cough.   All other systems reviewed and are negative.    Physical Exam Updated Vital Signs BP 132/81 (BP Location: Right Arm)   Pulse 98   Temp 99.3 F (37.4 C) (Oral)   Resp 22   SpO2 96%   Physical Exam  Constitutional: She is oriented to person, place, and time. She appears well-developed and well-nourished. No distress.  HENT:  Head: Normocephalic.  Nose:  Mucosal edema present.  Mouth/Throat: Oropharynx is clear and moist. No oropharyngeal exudate.  Eyes: Conjunctivae are normal.  Neck: Neck supple. No tracheal deviation present.  Cardiovascular: Normal rate, regular rhythm and normal heart sounds.   Pulmonary/Chest: Effort normal and breath sounds normal. No respiratory distress.  Abdominal: Soft. She exhibits no distension.  Neurological: She is alert and oriented to person, place, and time.  Skin: Skin is warm and dry. Capillary refill takes less than 2 seconds.  Psychiatric: She has a normal mood and affect.  Vitals reviewed.    ED Treatments / Results  Labs (all labs ordered are listed, but only abnormal results are displayed) Labs Reviewed - No data to display  EKG  EKG Interpretation None       Radiology No results found.  Procedures Procedures (including critical care time)  Medications Ordered in ED Medications - No data to display   Initial Impression / Assessment and Plan / ED Course  I have reviewed the triage vital signs and the nursing notes.  Pertinent labs & imaging results that were available during my care of the patient were reviewed by me and considered in my medical decision making (see chart for details).     56 y.o. female presents with nasal congestion progressing to sore throat and cough over the last week. Supportive care measures discussed. No signs  of respiratory distress, non-toxic appearing, CTAB, no concern for pneumonia with this clinical picture. No emergent testing indicated at this time. Pt discharged with likely viral cough from post-nasal drip which will be self limited in its course.   Final Clinical Impressions(s) / ED Diagnoses   Final diagnoses:  Viral URI with cough   New Prescriptions New Prescriptions   No medications on file     Lyndal Pulley, MD 07/27/16 [phone]

## 2016-07-27 NOTE — ED Triage Notes (Addendum)
Pt presents to ED with complaints of productive cough nasal congestion and drainage and sore throat

## 2016-09-24 ENCOUNTER — Emergency Department (HOSPITAL_BASED_OUTPATIENT_CLINIC_OR_DEPARTMENT_OTHER): Payer: BLUE CROSS/BLUE SHIELD

## 2016-09-24 ENCOUNTER — Emergency Department (HOSPITAL_BASED_OUTPATIENT_CLINIC_OR_DEPARTMENT_OTHER)
Admission: EM | Admit: 2016-09-24 | Discharge: 2016-09-24 | Disposition: A | Discharge status: Home / Self Care | Payer: BLUE CROSS/BLUE SHIELD | Attending: Emergency Medicine | Admitting: Emergency Medicine

## 2016-09-24 ENCOUNTER — Encounter (HOSPITAL_BASED_OUTPATIENT_CLINIC_OR_DEPARTMENT_OTHER): Payer: Self-pay

## 2016-09-24 DIAGNOSIS — M79645 Pain in left finger(s): Secondary | ICD-10-CM | POA: Insufficient documentation

## 2016-09-24 NOTE — ED Notes (Signed)
ED Provider at bedside. 

## 2016-09-24 NOTE — ED Notes (Signed)
ED Provider at bedside discussing test results and dispo plan of care. 

## 2016-09-24 NOTE — ED Triage Notes (Signed)
c/o pain to left thumb x 3 weeks-denies injury but states her work is "repetitious"-pain is worse with movement-NAD-steady gait

## 2016-09-24 NOTE — ED Provider Notes (Signed)
[facility] Provider Note   CSN: [id number] Arrival date & time: 09/24/16  1621  By signing my name below, I, Linna Darner, attest that this documentation has been prepared under the direction and in the presence of Lyndel Safe, [place]. Electronically Signed: Linna Darner, Scribe. 09/24/2016. 4:53 PM.  History   Chief Complaint Chief Complaint  Patient presents with  . Hand Pain    The history is provided by the patient. No language interpreter was used.     HPI Comments: Lisa Wagner is a right-hand dominant 56 y.o. female who presents to the Emergency Department complaining of constant, gradually worsening, 10/10 left thumb pain for three weeks. She reports associated swelling and occasional subluxation of her left thumb. Pt endorses pain exacerbation with flexion of her left thumb. No alleviating factors noted; she has applied ice to her left thumb and tried ibuprofen with no improvement of her pain or swelling. She states she does repetitive movements at work and believes this has contributed to her symptoms. She has had her current job for about one year but denies any left thumb problems until three weeks ago. No known trauma to her left thumb. Pt denies numbness/tingling, neuro deficits, pain in other fingers, or any other associated symptoms. No history of lacerations to the thumb, palm, wrist or arm.  History reviewed. No pertinent past medical history.  There are no active problems to display for this patient.   Past Surgical History:  Procedure Laterality Date  . BREAST REDUCTION SURGERY    . CARPAL TUNNEL RELEASE    . TUBAL LIGATION      OB History    No data available       Home Medications    Prior to Admission medications   Not on File    Family History No family history on file.  Social History Social History  Substance Use Topics  . Smoking status: Never Smoker  . Smokeless tobacco: Never Used  . Alcohol use No      Allergies   Latex   Review of Systems Review of Systems  Musculoskeletal: Positive for arthralgias and joint swelling.  Neurological: Negative for numbness.   Physical Exam Updated Vital Signs BP 121/64 (BP Location: Left Arm)   Pulse 79   Temp 98.6 F (37 C) (Oral)   Resp 18   Ht 5\' 5"  (1.651 m)   Wt 93.4 kg   SpO2 99%   BMI 34.28 kg/m   Physical Exam  Constitutional: She appears well-developed and well-nourished.  HENT:  Head: Normocephalic.  Eyes: Conjunctivae are normal.  Neck: Neck supple.  Cardiovascular: Normal rate.   Pulmonary/Chest: Effort normal.  Abdominal: She exhibits no distension.  Musculoskeletal:  Left hand: Does not exhibit any thenar atrophy. Digits are warm and well-perfused with intact sensory function. Good ROM at first MCP, limited ROM at first IP joint. With both active and passive ROM, first IP readily subluxes and relocates.  Neurological: She is alert.  Psychiatric: She has a normal mood and affect.  Nursing note and vitals reviewed.  ED Treatments / Results  Labs (all labs ordered are listed, but only abnormal results are displayed) Labs Reviewed - No data to display  EKG  EKG Interpretation None       Radiology Dg Finger Thumb Left  Result Date: 09/24/2016 CLINICAL DATA:  Three-week history of left thumb pain. EXAM: LEFT THUMB 2+V COMPARISON:  None. FINDINGS: Mild degenerative changes but no acute fracture or bone lesion. IMPRESSION: Mild  degenerative changes but no acute abnormality. Electronically Signed   By: Rudie MeyerP.  Gallerani M.D.   On: 09/24/2016 17:47    Procedures Procedures (including critical care time)  DIAGNOSTIC STUDIES: Oxygen Saturation is 99% on RA, normal by my interpretation.    COORDINATION OF CARE: 4:59 PM Discussed treatment plan with pt at bedside and pt agreed to plan.  Medications Ordered in ED Medications - No data to display   Initial Impression / Assessment and Plan / ED Course  I have  reviewed the triage vital signs and the nursing notes.  Pertinent labs & imaging results that were available during my care of the patient were reviewed by me and considered in my medical decision making (see chart for details). Lisa Wagner presents today with three weeks or worsening thumb pain.  On exam her left first IP joint easily dislocates and relocates.  X-rays showed no acute abnormality.   As the joint appears unstable she was given a thumb spica splint to immobilize it and given follow up with a hand surgeon for further evaluation. She was instructed to use Ibuprofen for pain along with ice and rest.    At this time there does not appear to be any evidence of an acute emergency medical condition and the patient appears stable for discharge with appropriate outpatient follow up.Diagnosis was discussed with patient who verbalizes understanding and is agreeable to discharge.      Final Clinical Impressions(s) / ED Diagnoses   Final diagnoses:  Thumb pain, left    New Prescriptions There are no discharge medications for this patient.  I personally performed the services described in this documentation, which was scribed in my presence. The recorded information has been reviewed and is accurate.    Earnstine Regallizabeth W RushvilleHammond, [place] 09/24/16 1837    Arby BarretteMarcy Pfeiffer, MD 09/28/16 [phone]

## 2016-09-24 NOTE — Discharge Instructions (Signed)
Please wear your splint.  You may take it off to shower.

## 2018-02-22 ENCOUNTER — Encounter (HOSPITAL_BASED_OUTPATIENT_CLINIC_OR_DEPARTMENT_OTHER): Payer: Self-pay | Admitting: Emergency Medicine

## 2018-02-22 ENCOUNTER — Other Ambulatory Visit: Payer: Self-pay

## 2018-02-22 ENCOUNTER — Emergency Department (HOSPITAL_BASED_OUTPATIENT_CLINIC_OR_DEPARTMENT_OTHER)
Admission: EM | Admit: 2018-02-22 | Discharge: 2018-02-22 | Disposition: A | Discharge status: Home / Self Care | Payer: BLUE CROSS/BLUE SHIELD | Attending: Emergency Medicine | Admitting: Emergency Medicine

## 2018-02-22 DIAGNOSIS — Y939 Activity, unspecified: Secondary | ICD-10-CM | POA: Insufficient documentation

## 2018-02-22 DIAGNOSIS — Y999 Unspecified external cause status: Secondary | ICD-10-CM | POA: Insufficient documentation

## 2018-02-22 DIAGNOSIS — S336XXA Sprain of sacroiliac joint, initial encounter: Secondary | ICD-10-CM | POA: Insufficient documentation

## 2018-02-22 DIAGNOSIS — X500XXA Overexertion from strenuous movement or load, initial encounter: Secondary | ICD-10-CM | POA: Insufficient documentation

## 2018-02-22 DIAGNOSIS — Y929 Unspecified place or not applicable: Secondary | ICD-10-CM | POA: Insufficient documentation

## 2018-02-22 HISTORY — DX: Sciatica, unspecified side: M54.30

## 2018-02-22 MED ORDER — IBUPROFEN 600 MG PO TABS: 600.0000 mg | ORAL_TABLET | Freq: Four times a day (QID) | ORAL | 0 refills | Status: AC | PRN

## 2018-02-22 MED ORDER — KETOROLAC TROMETHAMINE 60 MG/2ML IM SOLN
60.0000 mg | Freq: Once | INTRAMUSCULAR | Status: AC
  Administered 2018-02-22: 60 mg via INTRAMUSCULAR
  Filled 2018-02-22: qty 2

## 2018-02-22 MED ORDER — METHOCARBAMOL 500 MG PO TABS: 500.0000 mg | ORAL_TABLET | Freq: Two times a day (BID) | ORAL | 0 refills | Status: DC

## 2018-02-22 MED FILL — IBUPROFEN 600 MG TABLET: 600 | 7 days supply | Qty: 30 | Fill #0

## 2018-02-22 MED FILL — METHOCARBAMOL 500 MG TABLET: 500 | 10 days supply | Qty: 20 | Fill #0

## 2018-02-22 NOTE — Discharge Instructions (Signed)
Thank you for allowing me to care for you today in the Emergency Department. I hope you enjoy your cruise!  Take 600 mg of ibuprofen with food every 6 hours for pain control or 650 mg of Tylenol every 6 hours for pain control.  You can also alternate between these 2 medications every 3 hours.  You can apply ice or heat for 15 to 20 minutes up to 3-4 times a day.  Ice can help to reduce inflammation so than heat.  Start to stretch as your pain allows this can help to reduce tightness and inflammation.  Take 1 tablet of Robaxin 2 times daily.  This is a muscle relaxer and can help with muscle pain and spasms.  If your symptoms do not start to improve in 1 week, please call the number on your discharge paperwork to get established with a primary care provider.  Return to the emergency department if you develop new or worsening symptoms including if you start having numbness around your coin, if you start to pee or poop on yourself, if you develop a high fever, or if you develop weakness throughout 1 of your entire legs, or other new, concerning symptoms.

## 2018-02-22 NOTE — ED Triage Notes (Addendum)
Pt having lower right back pain radiating to leg with numbness noted since last Monday.  Pt pulled something.  Pt states her right calf feels weak.  No incontinence.

## 2018-02-22 NOTE — ED Provider Notes (Signed)
[facility] EMERGENCY DEPARTMENT Provider Note   CSN: [id number] Arrival date & time: 02/22/18  4098     History   Chief Complaint Chief Complaint  Patient presents with  . Back Pain    HPI Lisa Wagner is a 57 y.o. female with a history of sciatica who presents to the emergency department with a chief complaint of right-sided low back pain.  The patient endorses right-sided low back pain that began 8 days ago after she bent over to lift a 40 pound cart into the back of her vehicle.  She reports that she awoke the next morning with right-sided low back and hip pain that radiates down to her toes.   She reports the pain is constant.  Worse with ambulation and positional changes.  She has been able to walk, but has had a limp due to the pain.  She describes the pain as pulling.   She reports that she developed some decreased sensation to the top of her right foot when the right hip pain began she reports that the following day she had some decreased sensation to about the mid calf.  No increasing numbness since.  She states that her right ankle has felt more weak, but has improved after she started wearing an ankle brace.  States that she feels like this is consistent with her previous sciatica, but on the right side as opposed to the left.  She denies urinary or fecal incontinence, dysuria, hematuria, fever, chills, left lower extremity numbness or weakness.  No history of previous back injuries or surgeries.  The history is provided by the patient. No language interpreter was used.    Past Medical History:  Diagnosis Date  . Sciatica     There are no active problems to display for this patient.   Past Surgical History:  Procedure Laterality Date  . BREAST REDUCTION SURGERY    . CARPAL TUNNEL RELEASE    . TUBAL LIGATION       OB History   None      Home Medications    Prior to Admission medications   Medication Sig Start Date End Date Taking? Authorizing  Provider  ibuprofen (ADVIL,MOTRIN) 600 MG tablet Take 1 tablet (600 mg total) by mouth every 6 (six) hours as needed. 02/22/18   Vikash Nest A, PA-C  methocarbamol (ROBAXIN) 500 MG tablet Take 1 tablet (500 mg total) by mouth 2 (two) times daily. 02/22/18   Kendrick Haapala A, PA-C    Family History No family history on file.  Social History Social History   Tobacco Use  . Smoking status: Never Smoker  . Smokeless tobacco: Never Used  Substance Use Topics  . Alcohol use: No  . Drug use: No     Allergies   Latex   Review of Systems Review of Systems  Constitutional: Negative for activity change, chills and fever.  Respiratory: Negative for shortness of breath.   Cardiovascular: Negative for chest pain.  Gastrointestinal: Negative for abdominal pain.  Genitourinary: Negative for dysuria, flank pain and urgency.  Musculoskeletal: Positive for arthralgias, back pain, gait problem and myalgias. Negative for joint swelling, neck pain and neck stiffness.  Skin: Negative for rash.  Neurological: Positive for weakness and numbness.       No urinary or fecal incontinence     Physical Exam Updated Vital Signs BP (!) 149/76   Pulse 70   Temp 98 F (36.7 C) (Oral)   Resp 16   Ht 5'  5" (1.651 m)   Wt 95.1 kg   SpO2 100%   BMI 34.90 kg/m   Physical Exam  Constitutional: No distress.  HENT:  Head: Normocephalic.  Eyes: Conjunctivae are normal.  Neck: Neck supple.  Cardiovascular: Normal rate and regular rhythm. Exam reveals no gallop and no friction rub.  No murmur heard. Pulmonary/Chest: Effort normal. No stridor. No respiratory distress. She has no wheezes. She has no rales. She exhibits no tenderness.  Abdominal: Soft. She exhibits no distension and no mass. There is no tenderness. There is no rebound and no guarding. No hernia.  Musculoskeletal:  No tenderness to the cervical, thoracic, or lumbar spinous processes or bilateral paraspinal muscles.  Reproducible  tenderness to palpation to the right lumbar musculature and SI joint.  Negative straight leg raise bilaterally.  She has subjective decreased sensation to the distribution of the lateral sural cutaneous nerve to soft touch.  Sharp touch is intact.  Proprioception of the bilateral great toes is intact.  No clonus bilaterally.  5-5 strength against resistance against the bilateral large muscle groups of the thigh and calves.  DP and PT pulses are 2+ and symmetric.  Antalgic gait.  Able to bear weight on the bilateral lower extremities.  Full active and passive range of motion of the right hip, knee, and ankle.  No tenderness to the anterior lateral knee or IT band on the right.  Neurological: She is alert.  Skin: Skin is warm. No rash noted.  Psychiatric: Her behavior is normal.  Nursing note and vitals reviewed.    ED Treatments / Results  Labs (all labs ordered are listed, but only abnormal results are displayed) Labs Reviewed - No data to display  EKG None  Radiology No results found.  Procedures Procedures (including critical care time)  Medications Ordered in ED Medications  ketorolac (TORADOL) injection 60 mg (has no administration in time range)     Initial Impression / Assessment and Plan / ED Course  I have reviewed the triage vital signs and the nursing notes.  Pertinent labs & imaging results that were available during my care of the patient were reviewed by me and considered in my medical decision making (see chart for details).     57 year old female with a history of left-sided sciatica presenting with right-sided low back pain that radiates down the right leg.  She reports decreased sensation to the right calf as well as weakness.  On physical exam, the patient has 5 out of 5 strength against resistance of all large muscle groups of the bilateral lower extremities.  She is able to positionally move from the bed to the floor and ambulate without assistance.   Minimal subjective decreased sensation to soft touch, but sharp touch and proprioception are intact.  Negative straight leg raise bilaterally.  No urinary or fecal incontinence.  At this time, I have a low suspicion for fracture or spinal cord involvement.  Doubt lumbar radiculopathy or cauda equina.  Suspect mechanical injury and SI strain given tenderness on her exam.  Toradol given in the ED for pain control.  Will discharge with Robaxin, ibuprofen, hip stretches, and RICE therapy.  Strict return precautions given.  She is hemodynamically stable in no acute distress.  She is safe for discharge home with outpatient follow-up at this time.  Final Clinical Impressions(s) / ED Diagnoses   Final diagnoses:  Sprain of sacroiliac ligament, initial encounter    ED Discharge Orders  Ordered    ibuprofen (ADVIL,MOTRIN) 600 MG tablet  Every 6 hours PRN     02/22/18 1113    methocarbamol (ROBAXIN) 500 MG tablet  2 times daily     02/22/18 1113           Zalea Pete A, PA-C 02/22/18 1119    Sabas SousBero, Michael M, MD 02/22/18 [phone]

## 2021-12-10 ENCOUNTER — Ambulatory Visit
Admission: RE | Admit: 2021-12-10 | Discharge: 2021-12-10 | Discharge status: Absent without leave | Payer: Self-pay | Source: Ambulatory Visit

## 2021-12-10 ENCOUNTER — Encounter: Payer: Self-pay | Admitting: Emergency Medicine

## 2021-12-10 ENCOUNTER — Other Ambulatory Visit: Payer: Self-pay

## 2021-12-10 ENCOUNTER — Ambulatory Visit
Admission: EM | Admit: 2021-12-10 | Discharge: 2021-12-10 | Disposition: A | Discharge status: Home / Self Care | Payer: Self-pay | Attending: Internal Medicine | Admitting: Internal Medicine

## 2021-12-10 DIAGNOSIS — B029 Zoster without complications: Secondary | ICD-10-CM

## 2021-12-10 MED ORDER — VALACYCLOVIR HCL 1 G PO TABS: 1000.0000 mg | ORAL_TABLET | Freq: Three times a day (TID) | ORAL | 0 refills | Status: AC

## 2021-12-10 NOTE — Discharge Instructions (Signed)
You have shingles which is being treated with an antiviral medication.  Please follow-up if rash persist or overlies eye.

## 2021-12-10 NOTE — ED Provider Notes (Signed)
[facility]    CSN: [id number] Arrival date & time: 12/10/21  1355      History   Chief Complaint Chief Complaint  Patient presents with   Appointment    1400   Rash    HPI Lisa Wagner is a 61 y.o. female.   Patient presents with rash to right forehead that has been present for approximately 5 days.  Rash is painful and itchy.  Denies any changes to the environment including lotions, soaps, detergents, foods, etc.  Denies any associated fever. Patient reports that it also feels tingly at times.    Rash   Past Medical History:  Diagnosis Date   Sciatica     There are no problems to display for this patient.   Past Surgical History:  Procedure Laterality Date   BREAST REDUCTION SURGERY     CARPAL TUNNEL RELEASE     TUBAL LIGATION      OB History   No obstetric history on file.      Home Medications    Prior to Admission medications   Medication Sig Start Date End Date Taking? Authorizing Provider  valACYclovir (VALTREX) 1000 MG tablet Take 1 tablet (1,000 mg total) by mouth 3 (three) times daily for 7 days. 12/10/21 12/17/21 Yes Zyaire Mccleod, Acie Fredrickson, FNP  ibuprofen (ADVIL,MOTRIN) 600 MG tablet Take 1 tablet (600 mg total) by mouth every 6 (six) hours as needed. 02/22/18   McDonald, Mia A, PA-C  methocarbamol (ROBAXIN) 500 MG tablet Take 1 tablet (500 mg total) by mouth 2 (two) times daily. 02/22/18   McDonald, Coral Else, PA-C    Family History History reviewed. No pertinent family history.  Social History Social History   Tobacco Use   Smoking status: Never   Smokeless tobacco: Never  Substance Use Topics   Alcohol use: No   Drug use: No     Allergies   Latex   Review of Systems Review of Systems Per HPI  Physical Exam Triage Vital Signs ED Triage Vitals [12/10/21 1436]  Enc Vitals Group     BP 123/64     Pulse Rate 83     Resp 18     Temp 98.4 F (36.9 C)     Temp Source Oral     SpO2 96 %     Weight      Height      Head  Circumference      Peak Flow      Pain Score 3     Pain Loc      Pain Edu?      Excl. in GC?    No data found.  Updated Vital Signs BP 123/64 (BP Location: Left Arm)   Pulse 83   Temp 98.4 F (36.9 C) (Oral)   Resp 18   SpO2 96%   Visual Acuity Right Eye Distance:   Left Eye Distance:   Bilateral Distance:    Right Eye Near:   Left Eye Near:    Bilateral Near:     Physical Exam Constitutional:      General: She is not in acute distress.    Appearance: Normal appearance. She is not toxic-appearing or diaphoretic.  HENT:     Head: Normocephalic and atraumatic.      Comments: Erythematous papular, vesicular rash present to right forehead.  Rash does not overlap eye. No drainage noted.  Eyes:     Extraocular Movements: Extraocular movements intact.     Conjunctiva/sclera: Conjunctivae normal.  Pulmonary:     Effort: Pulmonary effort is normal.  Neurological:     General: No focal deficit present.     Mental Status: She is alert and oriented to person, place, and time. Mental status is at baseline.  Psychiatric:        Mood and Affect: Mood normal.        Behavior: Behavior normal.        Thought Content: Thought content normal.        Judgment: Judgment normal.      UC Treatments / Results  Labs (all labs ordered are listed, but only abnormal results are displayed) Labs Reviewed - No data to display  EKG   Radiology No results found.  Procedures Procedures (including critical care time)  Medications Ordered in UC Medications - No data to display  Initial Impression / Assessment and Plan / UC Course  I have reviewed the triage vital signs and the nursing notes.  Pertinent labs & imaging results that were available during my care of the patient were reviewed by me and considered in my medical decision making (see chart for details).     Rash is consistent with shingles.  Will treat with antiviral Valtrex.  Rash is not overlying eye so no concern  for complications with this.  Patient also denies blurry vision.  Patient was given strict return and ER precautions.  Also advised patient to follow-up with PCP.  Advised patient to follow-up if rash begins to worsen or begins to overly eye.  Patient verbalized understanding and was agreeable with plan. Final Clinical Impressions(s) / UC Diagnoses   Final diagnoses:  Herpes zoster without complication     Discharge Instructions      You have shingles which is being treated with an antiviral medication.  Please follow-up if rash persist or overlies eye.    ED Prescriptions     Medication Sig Dispense Auth. Provider   valACYclovir (VALTREX) 1000 MG tablet Take 1 tablet (1,000 mg total) by mouth 3 (three) times daily for 7 days. 21 tablet [place], Acie Fredrickson, [place]      PDMP not reviewed this encounter.   Gustavus Bryant, [place] 12/10/21 1501

## 2021-12-10 NOTE — ED Triage Notes (Signed)
Pt here for rash above right eye into hairline x 5 days that is painful and itchy

## 2023-02-14 ENCOUNTER — Other Ambulatory Visit: Payer: Self-pay

## 2023-02-14 ENCOUNTER — Emergency Department (HOSPITAL_COMMUNITY): Payer: BC Managed Care – PPO

## 2023-02-14 ENCOUNTER — Encounter (HOSPITAL_COMMUNITY): Payer: Self-pay | Admitting: Emergency Medicine

## 2023-02-14 ENCOUNTER — Emergency Department (HOSPITAL_COMMUNITY)
Admission: EM | Admit: 2023-02-14 | Discharge: 2023-02-14 | Disposition: A | Discharge status: Home / Self Care | Payer: BC Managed Care – PPO | Attending: Emergency Medicine | Admitting: Emergency Medicine

## 2023-02-14 DIAGNOSIS — J988 Other specified respiratory disorders: Secondary | ICD-10-CM

## 2023-02-14 DIAGNOSIS — R0602 Shortness of breath: Secondary | ICD-10-CM | POA: Diagnosis present

## 2023-02-14 DIAGNOSIS — Z91040 Latex allergy status: Secondary | ICD-10-CM | POA: Insufficient documentation

## 2023-02-14 DIAGNOSIS — U071 COVID-19: Secondary | ICD-10-CM | POA: Insufficient documentation

## 2023-02-14 NOTE — ED Triage Notes (Signed)
Patient presents post Covid test last night. She reports feeling something is choking her to where she feels she can not breath at all.

## 2023-02-14 NOTE — Discharge Instructions (Signed)
Your x-ray of your neck was negative.  Like we discussed, you can do nasal saline washes to help improve the congestion that you are having.  You can take decongestants.  Follow-up with your primary care doctor.

## 2023-02-14 NOTE — ED Provider Notes (Signed)
[facility] EMERGENCY DEPARTMENT AT [facility] Provider Note   CSN: [id number] Arrival date & time: 02/14/23  1804     History  Chief Complaint  Patient presents with   Shortness of Breath   Covid Positive    Dashanna Serna is a 62 y.o. female.  This is a 63 year old female who presents to the emergency department today due to 2 episodes of sudden difficulty with breathing which then resolved.  Patient tested positive for COVID-19 this morning.  She says that 2 times earlier today, she suddenly was unable to breathe, saying that she felt as though her throat was obstructed.  Symptoms lasted for a few seconds before improving.  She does endorse nasal discharge.  No cough.  No dyspnea.   Shortness of Breath      Home Medications Prior to Admission medications   Medication Sig Start Date End Date Taking? Authorizing Provider  ibuprofen (ADVIL,MOTRIN) 600 MG tablet Take 1 tablet (600 mg total) by mouth every 6 (six) hours as needed. 02/22/18   McDonald, Mia A, PA-C  methocarbamol (ROBAXIN) 500 MG tablet Take 1 tablet (500 mg total) by mouth 2 (two) times daily. 02/22/18   McDonald, Mia A, PA-C      Allergies    Latex    Review of Systems   Review of Systems  Respiratory:  Positive for shortness of breath.     Physical Exam Updated Vital Signs BP 133/77   Pulse 78   Temp 98.6 F (37 C) (Oral)   Resp 20   SpO2 99%  Physical Exam Vitals reviewed.  Constitutional:      General: She is not in acute distress. HENT:     Head: Atraumatic.  Cardiovascular:     Rate and Rhythm: Normal rate.  Pulmonary:     Effort: Pulmonary effort is normal. No tachypnea.     Breath sounds: Normal breath sounds. No stridor.  Musculoskeletal:     Cervical back: Normal range of motion.  Neurological:     Mental Status: She is alert.     ED Results / Procedures / Treatments   Labs (all labs ordered are listed, but only abnormal results are displayed) Labs Reviewed - No  data to display  EKG None  Radiology DG Neck Soft Tissue  Result Date: 02/14/2023 CLINICAL DATA:  Tested positive for COVID yesterday. Cough feeling like gasping for air. Abscess. EXAM: NECK SOFT TISSUES - 1+ VIEW COMPARISON:  None Available. FINDINGS: There is no evidence of retropharyngeal soft tissue swelling or epiglottic enlargement. The cervical airway is unremarkable and no radio-opaque foreign body identified. IMPRESSION: Negative. Electronically Signed   By: Minerva Fester M.D.   On: 02/14/2023 19:19    Procedures Procedures    Medications Ordered in ED Medications - No data to display  ED Course/ Medical Decision Making/ A&P                                 Medical Decision Making 62 year old female here today with 2 episodes of upper respiratory obstruction.  Plan-on exam, patient does not have any stridor, and oropharynx, there is mild erythema.  No uvula deviation, no pooling of secretions.  Voice normal.  Old plain soft tissue films of the patient's neck.  My suspicion is the patient just has thick nasal discharge, which is intermittently causing transient obstruction.  Reassessment-no acute findings on x-ray soft tissue neck.  Will discharge  patient home.  Amount and/or Complexity of Data Reviewed Radiology: ordered.           Final Clinical Impression(s) / ED Diagnoses Final diagnoses:  Congestion of upper airway    Rx / DC Orders ED Discharge Orders     None         Arletha Pili, DO 02/14/23 1931

## 2023-07-30 ENCOUNTER — Encounter: Payer: Self-pay | Admitting: Allergy

## 2023-07-30 ENCOUNTER — Ambulatory Visit (INDEPENDENT_AMBULATORY_CARE_PROVIDER_SITE_OTHER): Payer: 59 | Admitting: Allergy

## 2023-07-30 ENCOUNTER — Other Ambulatory Visit: Payer: Self-pay

## 2023-07-30 VITALS — BP 126/76 | HR 93 | Temp 98.4°F | Ht 65.0 in | Wt 208.1 lb

## 2023-07-30 DIAGNOSIS — L508 Other urticaria: Secondary | ICD-10-CM | POA: Diagnosis not present

## 2023-07-30 DIAGNOSIS — J4520 Mild intermittent asthma, uncomplicated: Secondary | ICD-10-CM

## 2023-07-30 DIAGNOSIS — T781XXD Other adverse food reactions, not elsewhere classified, subsequent encounter: Secondary | ICD-10-CM | POA: Diagnosis not present

## 2023-07-30 DIAGNOSIS — L509 Urticaria, unspecified: Secondary | ICD-10-CM

## 2023-07-30 DIAGNOSIS — L2489 Irritant contact dermatitis due to other agents: Secondary | ICD-10-CM

## 2023-07-30 MED ORDER — ALBUTEROL SULFATE HFA 108 (90 BASE) MCG/ACT IN AERS: 2.0000 | INHALATION_SPRAY | Freq: Four times a day (QID) | RESPIRATORY_TRACT | 2 refills | Status: AC | PRN

## 2023-07-30 NOTE — Patient Instructions (Addendum)
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 Also reports occasional reactions to certain detergents. Symptoms have been ongoing since approximately 2018 and have worsened over the past four years. -Recommed patch testing to identify potential contact allergens.  Patch testing is the test of choice to evaluate for contact dermatitis.  Recommend performing patch testing with the TRUE test patch panels.  Patches are best placed on a Monday with return to office on Wednesday and Friday of same week for readings.  Once patches are in place to do not get them wet.  You can take antihistamines while patches are in place.   True Test looks for the following sensitivities:     -Consider use of antihistamines such as Zyrtec, Allegra, or Xyzal for symptom management.  Reactive Airway Disease Reports of coughing, shortness of breath, and airway constriction in response to exposure to certain aerosols, sprays, and perfumes. Symptoms worsened following COVID-19 infection in 2023. -Lung function testing is normal today! -Have access to albuterol inhaler 2 puffs every 4-6 hours as needed for cough/wheeze/shortness of breath/chest tightness.  May use 15-20 minutes prior to activity.   Monitor frequency of use and if effective. -Do your best to avoid strong odor exposures  Adverse food reaction Reports of oral symptoms (tingling, irritation) following consumption of certain fruits and salty items. -Order lab work to assess for potential food allergies and environmental allergens (as may be oral allergy syndrome related to pollen allergy). -Continue current fruit avoidance  Follow-up for routine visit in 3-4 months. Schedule for patch testing.

## 2023-07-30 NOTE — Progress Notes (Signed)
New Patient Note  RE: Lisa Wagner MRN: [id number] DOB: 1961-06-06 Date of Office Visit: 07/30/2023  Primary care provider: Patient, No Pcp Per  Chief Complaint: rash  History of present illness: Lisa Wagner is a 63 y.o. female presenting today for evaluation of allergy. Discussed the use of AI scribe software for clinical note transcription with the patient, who gave verbal consent to proceed.  Symptoms of rash and itching are triggered by exposure to elastic and latex products, such as gloves, bras, and socks. The rash begins internally and surfaces as red bumps, eventually resembling 'alligator skin.' Moisture and contact with elastic materials exacerbate the condition, causing significant discomfort. Symptoms have been present since 2018, worsening over the past four years, with daily itching and tingling when wearing elastic. Relief is achieved by removing elastic items, and she has not used medications like Benadryl or creams.  She is sensitive to aerosols, sprays, and perfumes, which cause airway irritation, coughing, and a sensation of tightness. After contracting COVID-19 in 2023, she experienced significant breathing difficulties, with episodes of airway obstruction and severe coughing, particularly in September of that year. These episodes have been severe enough to cause vomiting and require her to pull over while driving due to lack of air.  She has a history of food-related oral symptoms, particularly with pineapples, which cause her tongue to feel 'torn up.' Similar issues occur with grapes, apples, cantaloupe, and plums, especially when consumed with salty foods, leading to severe oral discomfort.  She experiences irritation and itching with certain detergents, though these do not necessarily contain latex. No symptoms of seasonal allergies such as sneezing, runny or stuffy nose, and itchy, watery eyes.      Review of systems: 10pt ROS negative unless noted above in  HPI  All other systems negative unless noted above in HPI  Past medical history: Past Medical History:  Diagnosis Date   Sciatica     Past surgical history: Past Surgical History:  Procedure Laterality Date   BREAST REDUCTION SURGERY     CARPAL TUNNEL RELEASE     TUBAL LIGATION      Family history:  Family History  Problem Relation Age of Onset   Asthma Daughter     Social history: Lives in a home with concrete underneath carpet with electric heating with central and window cooling.  There may be water damage or mildew in the home.  No concern for roaches in the home.  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  She denies a smoking history.   Medication List: Current Outpatient Medications  Medication Sig Dispense Refill   ibuprofen (ADVIL,MOTRIN) 600 MG tablet Take 1 tablet (600 mg total) by mouth every 6 (six) hours as needed. (Patient not taking: Reported on 07/30/2023) 30 tablet 0   methocarbamol (ROBAXIN) 500 MG tablet Take 1 tablet (500 mg total) by mouth 2 (two) times daily. 20 tablet 0   No current facility-administered medications for this visit.    Known medication allergies: Allergies  Allergen Reactions   Latex Rash     Physical examination: Blood pressure 126/76, pulse 93, temperature 98.4 F (36.9 C), height 5\' 5"  (1.651 m), weight 208 lb 1.6 oz (94.4 kg), SpO2 96%.  General: Alert, interactive, in no acute distress. HEENT: PERRLA, TMs pearly gray, turbinates non-edematous without discharge, post-pharynx non erythematous. Neck: Supple without lymphadenopathy. Lungs: Clear to auscultation without wheezing, rhonchi or rales. {no increased work of breathing. CV: Normal S1, S2 without murmurs. Abdomen: Nondistended, nontender. Skin: Warm and  dry, without lesions or rashes. Extremities:  No clubbing, cyanosis or edema. Neuro:   Grossly intact.  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 Also reports occasional reactions to certain detergents. Symptoms have been ongoing since approximately 2018 and have worsened over the past four years. -Recommed patch testing to identify potential contact allergens.  Patch testing is the test of choice to evaluate for contact dermatitis.  Recommend performing patch testing with the TRUE test patch panels.  Patches are best placed on a Monday with return to office on Wednesday and Friday of same week for readings.  Once patches are in place to do not get them wet.  You can take antihistamines while patches are in place.  -Consider use of antihistamines such as Zyrtec, Allegra, or Xyzal for symptom management.  Reactive Airway Disease Reports of coughing, shortness of breath, and airway constriction in response to exposure to certain aerosols, sprays, and perfumes. Symptoms worsened following COVID-19 infection in 2023. -Lung function testing is normal today! -Have access to albuterol inhaler 2 puffs every 4-6 hours as needed for cough/wheeze/shortness of breath/chest tightness.  May use 15-20 minutes prior to activity.   Monitor frequency of use and if effective. -Do your best to avoid strong odor exposures  Adverse food reaction Reports of oral symptoms (tingling, irritation) following consumption of certain fruits and salty items. -Order lab work to assess for potential food allergies and environmental allergens (as may be oral allergy syndrome related to pollen allergy). -Continue current fruit avoidance  Follow-up for routine visit in 3-4 months. Schedule for patch testing.   I appreciate the opportunity to take part in Lisa Wagner's care. Please do not hesitate to contact me with questions.  Sincerely,   Margo Aye, MD Allergy/Immunology [facility]

## 2023-08-07 LAB — CBC WITH DIFFERENTIAL/PLATELET
Basophils Absolute: 0 10*3/uL (ref 0.0–0.2)
Basos: 1 %
EOS (ABSOLUTE): 0 10*3/uL (ref 0.0–0.4)
Eos: 1 %
Hematocrit: 41.3 % (ref 34.0–46.6)
Hemoglobin: 13.4 g/dL (ref 11.1–15.9)
Immature Grans (Abs): 0 10*3/uL (ref 0.0–0.1)
Immature Granulocytes: 0 %
Lymphocytes Absolute: 0.8 10*3/uL (ref 0.7–3.1)
Lymphs: 22 %
MCH: 30.9 pg (ref 26.6–33.0)
MCHC: 32.4 g/dL (ref 31.5–35.7)
MCV: 95 fL (ref 79–97)
Monocytes Absolute: 0.5 10*3/uL (ref 0.1–0.9)
Monocytes: 14 %
Neutrophils Absolute: 2.3 10*3/uL (ref 1.4–7.0)
Neutrophils: 62 %
Platelets: 210 10*3/uL (ref 150–450)
RBC: 4.34 x10E6/uL (ref 3.77–5.28)
RDW: 12.4 % (ref 11.7–15.4)
WBC: 3.6 10*3/uL (ref 3.4–10.8)

## 2023-08-07 LAB — ALLERGENS W/TOTAL IGE AREA 2

## 2023-08-07 LAB — COMPREHENSIVE METABOLIC PANEL
ALT: 21 [IU]/L (ref 0–32)
AST: 27 [IU]/L (ref 0–40)
Albumin: 4.4 g/dL (ref 3.9–4.9)
Alkaline Phosphatase: 53 [IU]/L (ref 44–121)
BUN/Creatinine Ratio: 9 — ABNORMAL LOW (ref 12–28)
BUN: 8 mg/dL (ref 8–27)
Bilirubin Total: 0.7 mg/dL (ref 0.0–1.2)
CO2: 22 mmol/L (ref 20–29)
Calcium: 9.4 mg/dL (ref 8.7–10.3)
Chloride: 103 mmol/L (ref 96–106)
Creatinine, Ser: 0.92 mg/dL (ref 0.57–1.00)
Globulin, Total: 3 g/dL (ref 1.5–4.5)
Glucose: 88 mg/dL (ref 70–99)
Potassium: 4 mmol/L (ref 3.5–5.2)
Sodium: 142 mmol/L (ref 134–144)
Total Protein: 7.4 g/dL (ref 6.0–8.5)
eGFR: 71 mL/min/{1.73_m2} (ref >59)

## 2023-08-07 LAB — ALLERGEN, PINEAPPLE, F210: Pineapple IgE: <0.1 kU/L

## 2023-08-07 LAB — CHRONIC URTICARIA: cu index: <6.7 (ref <10)

## 2023-08-07 LAB — THYROID ANTIBODIES
Thyroglobulin Antibody: <1 [IU]/mL (ref 0.0–0.9)
Thyroperoxidase Ab SerPl-aCnc: 20 [IU]/mL (ref 0–34)

## 2023-08-07 LAB — TRYPTASE: Tryptase: 3.2 ug/L (ref 2.2–13.2)

## 2023-08-07 LAB — ALLERGEN, STRAWBERRY, F44: Allergen Strawberry IgE: <0.1 kU/L

## 2023-08-07 LAB — F255-IGE PLUM: F255-IgE Plum: <0.1 kU/L

## 2023-08-07 LAB — ALLERGEN GRAPE F259: Allergen Grape IgE: <0.1 kU/L

## 2023-08-07 LAB — ALLERGEN, APPLE F49: Allergen Apple, IgE: <0.1 kU/L

## 2023-08-07 LAB — TSH: TSH: 2.11 u[IU]/mL (ref 0.450–4.500)

## 2023-08-07 LAB — ALLERGEN CANTALOUPE: Allergen Melon IgE: <0.1 kU/L

## 2023-08-09 ENCOUNTER — Encounter: Payer: Self-pay | Admitting: Allergy

## 2023-08-10 NOTE — Telephone Encounter (Signed)
 Forwarding patient myChart message to have lab test results reviewed/make recommendations - send patient myChart message.

## 2023-08-11 ENCOUNTER — Encounter: Payer: Self-pay | Admitting: Allergy

## 2023-08-14 NOTE — Progress Notes (Signed)
    Follow-up Note  RE: Lisa Wagner MRN: [id number] DOB: 1960/12/13 Date of Office Visit: 08/17/2023  Primary care provider: Patient, No Pcp Per Referring provider: No ref. provider found   Lisa Wagner returns to the office today for the patch test placement, given suspected history of contact dermatitis.    Diagnostics:  TRUE Test patches placed.    Plan:   Suspected Allergic contact dermatitis  Discussed with patient that patch testing tests for contact dermatitis and sometimes it does not correlate to how one will react to  allergens   in the body. Positive patch testing results can help in avoiding those items however it is possible to get false negative results.  Nevertheless, this is one of the most accessible test for  contact dermatitis  currently available  - Instructions provided on care of the patches for the next 48 hours. - Lisa Wagner was instructed to avoid showering for the next 48 hours. - Lisa Wagner will follow up in 48 hours and 96 hours for patch readings.     Tonny Bollman, MD [facility]

## 2023-08-17 ENCOUNTER — Encounter: Payer: Self-pay | Admitting: Internal Medicine

## 2023-08-17 ENCOUNTER — Ambulatory Visit (INDEPENDENT_AMBULATORY_CARE_PROVIDER_SITE_OTHER): Payer: 59 | Admitting: Internal Medicine

## 2023-08-17 DIAGNOSIS — L2389 Allergic contact dermatitis due to other agents: Secondary | ICD-10-CM | POA: Diagnosis not present

## 2023-08-19 ENCOUNTER — Encounter: Payer: Self-pay | Admitting: Family

## 2023-08-19 ENCOUNTER — Ambulatory Visit (INDEPENDENT_AMBULATORY_CARE_PROVIDER_SITE_OTHER): Payer: 59 | Admitting: Family

## 2023-08-19 ENCOUNTER — Telehealth: Payer: Self-pay | Admitting: Family

## 2023-08-19 DIAGNOSIS — L2389 Allergic contact dermatitis due to other agents: Secondary | ICD-10-CM

## 2023-08-19 NOTE — Telephone Encounter (Signed)
 Mychart letter for todays appt was sent to her

## 2023-08-19 NOTE — Telephone Encounter (Signed)
 Patient called and stated if she can get a doctors note sent to her my chart

## 2023-08-19 NOTE — Progress Notes (Signed)
 Kaiyah returns to the office today for the 48 hour patch test interpretation, given suspected history of contact dermatitis.    Diagnostics:   TRUE TEST 48-hour hour reading: all negative   Plan:   Allergic contact dermatitis - Return Friday 08/21/23 for final patch read - Ok to shower now. Do not scrub back or place soap on back - May take an over the counter antihistamine such as Claritin (loratadine), Zyrtec (cetirizine), Xyzal (levocetirizine), or Allegra (fexofenadine) once a day to help with itching if needed  Nehemiah Settle, FNP [facility]

## 2023-08-21 ENCOUNTER — Encounter: Payer: 59 | Admitting: Allergy

## 2023-08-21 ENCOUNTER — Ambulatory Visit: Payer: 59 | Admitting: Allergy

## 2023-08-21 ENCOUNTER — Encounter: Payer: Self-pay | Admitting: Allergy

## 2023-08-21 DIAGNOSIS — L2389 Allergic contact dermatitis due to other agents: Secondary | ICD-10-CM | POA: Diagnosis not present

## 2023-08-21 MED ORDER — TACROLIMUS 0.1 % EX OINT: TOPICAL_OINTMENT | Freq: Two times a day (BID) | CUTANEOUS | 3 refills | Status: AC | PRN

## 2023-08-21 NOTE — Progress Notes (Signed)
    Follow-up Note  RE: Lisa Wagner MRN: [id number] DOB: 02-14-1961 Date of Office Visit: 08/21/2023  [name] returns to the office today for the final patch test interpretation, given suspected history of contact dermatitis.    Diagnostics:  TRUE TEST 96 hour reading:   T.R.U.E. Test     Row Name 08/21/23 1300           Time Antigen Placed 1130       Manufacturer [employer]       Lot # [id number]       Location Back       Number of Test 36       Reading Interval Day 1;Day 3;Day 5       Panel Panel 1;Panel 2;Panel 3       3. Neomycin Sulfate 0       4. Potassium Dichromate 0       5. Caine Mix 0       6. Fragrance Mix 0       7. Colophony 1       8. Paraben Mix 0       9. Negative Control 0       10. Balsam of [place] 0       11. Ethylenediamine Dihydrochloride 0       12. Cobalt Dichloride 0       13. p-tert Butylphenol Formaldehyde Resin 0       14. Epoxy Resin 0       15. Carba Mix 0       16.  Black Rubber Mix 0       17. Cl+ Me-Isothiazolinone 0       18. Quaternium-15 0       19. Methyldibromo Glutaronitrile 0       20. p-Phenylenediamine 0       21. Formaldehyde 0       22. Mercapto Mix 0       23. Thimerosal 0       24. Thiuram Mix 0       25. Diazolidinyl Urea 0       26. Quinoline Mix 0       27. Tixocortol-21-Pivalate 0       28. Gold Sodium Thiosulfate 0       29. Imidazolidinyl Urea 0       30. Budesonide 0       31. Hydrocortisone-17-Butyrate 0       32. Mercaptobenzothiazole 0       33. Bacitracin 0       34. Parthenolide 0       35. Disperse Blue 106 0       36. 2-Bromo-2-Nitropropane-1,3-diol 0                 Plan:  Allergic contact dermatitis The patient has been provided detailed information regarding the substances she is sensitive to, as well as products containing the substances.  Meticulous avoidance of these substances is recommended. If avoidance is not possible, the use of barrier creams or lotions is recommended. Product safety list  from the ACDS provided via email to [email or web address]  Margo Aye, MD [facility]
